# Patient Record
Sex: Female | Born: 1975 | Race: Black or African American | Hispanic: No | Marital: Single | Smoking: Never smoker
Health system: Southern US, Community
[De-identification: ages and names within clinical notes are randomized; demographics above are authoritative.]

## PROBLEM LIST (undated history)

## (undated) DIAGNOSIS — Z8489 Family history of other specified conditions: Secondary | ICD-10-CM

## (undated) DIAGNOSIS — R011 Cardiac murmur, unspecified: Secondary | ICD-10-CM

## (undated) DIAGNOSIS — D649 Anemia, unspecified: Secondary | ICD-10-CM

## (undated) DIAGNOSIS — T883XXA Malignant hyperthermia due to anesthesia, initial encounter: Secondary | ICD-10-CM

## (undated) HISTORY — PX: NO PAST SURGERIES: SHX2092

---

## 2010-06-08 ENCOUNTER — Emergency Department (HOSPITAL_BASED_OUTPATIENT_CLINIC_OR_DEPARTMENT_OTHER)
Admission: EM | Admit: 2010-06-08 | Discharge: 2010-06-08 | Payer: Self-pay | Source: Home / Self Care | Admitting: Emergency Medicine

## 2010-06-09 LAB — URINALYSIS, ROUTINE W REFLEX MICROSCOPIC
Bilirubin Urine: NEGATIVE
Protein, ur: NEGATIVE mg/dL
Urobilinogen, UA: 0.2 mg/dL (ref 0.0–1.0)

## 2010-06-09 LAB — BASIC METABOLIC PANEL
CO2: 25 mEq/L (ref 19–32)
Chloride: 108 mEq/L (ref 96–112)
Creatinine, Ser: 0.7 mg/dL (ref 0.4–1.2)
GFR calc Af Amer: >60 mL/min (ref >60)
Potassium: 4 mEq/L (ref 3.5–5.1)

## 2010-06-09 LAB — CBC
Hemoglobin: 13.1 g/dL (ref 12.0–15.0)
MCH: 25.6 pg — ABNORMAL LOW (ref 26.0–34.0)
MCV: 72.7 fL — ABNORMAL LOW (ref 78.0–100.0)
RBC: 5.12 MIL/uL — ABNORMAL HIGH (ref 3.87–5.11)

## 2010-06-09 LAB — DIFFERENTIAL
Basophils Relative: 0 % (ref 0–1)
Lymphs Abs: 1.7 10*3/uL (ref 0.7–4.0)
Monocytes Relative: 8 % (ref 3–12)
Neutro Abs: 2.5 10*3/uL (ref 1.7–7.7)
Neutrophils Relative %: 52 % (ref 43–77)

## 2010-09-12 ENCOUNTER — Emergency Department (HOSPITAL_BASED_OUTPATIENT_CLINIC_OR_DEPARTMENT_OTHER)
Admission: EM | Admit: 2010-09-12 | Discharge: 2010-09-13 | Disposition: A | Discharge status: Home / Self Care | Payer: Self-pay | Attending: Emergency Medicine | Admitting: Emergency Medicine

## 2010-09-12 DIAGNOSIS — N898 Other specified noninflammatory disorders of vagina: Secondary | ICD-10-CM | POA: Insufficient documentation

## 2010-09-12 DIAGNOSIS — D500 Iron deficiency anemia secondary to blood loss (chronic): Secondary | ICD-10-CM | POA: Insufficient documentation

## 2010-09-12 DIAGNOSIS — I10 Essential (primary) hypertension: Secondary | ICD-10-CM | POA: Insufficient documentation

## 2010-09-12 LAB — WET PREP, GENITAL: Trich, Wet Prep: NONE SEEN

## 2010-09-12 LAB — PREGNANCY, URINE: Preg Test, Ur: NEGATIVE

## 2010-09-12 LAB — URINALYSIS, ROUTINE W REFLEX MICROSCOPIC
Ketones, ur: NEGATIVE mg/dL
Nitrite: NEGATIVE
Protein, ur: NEGATIVE mg/dL
Urobilinogen, UA: 0.2 mg/dL (ref 0.0–1.0)
pH: 6 (ref 5.0–8.0)

## 2010-09-12 LAB — CBC
Hemoglobin: 9.6 g/dL — ABNORMAL LOW (ref 12.0–15.0)
MCH: 25.6 pg — ABNORMAL LOW (ref 26.0–34.0)
MCHC: 33.7 g/dL (ref 30.0–36.0)
Platelets: 260 10*3/uL (ref 150–400)
RDW: 12.5 % (ref 11.5–15.5)

## 2010-09-13 ENCOUNTER — Ambulatory Visit (INDEPENDENT_AMBULATORY_CARE_PROVIDER_SITE_OTHER)
Admission: RE | Admit: 2010-09-13 | Discharge: 2010-09-13 | Disposition: A | Discharge status: Home / Self Care | Payer: Self-pay | Source: Ambulatory Visit | Attending: Emergency Medicine | Admitting: Emergency Medicine

## 2010-09-13 ENCOUNTER — Ambulatory Visit (HOSPITAL_BASED_OUTPATIENT_CLINIC_OR_DEPARTMENT_OTHER)
Admission: RE | Admit: 2010-09-13 | Discharge: 2010-09-13 | Disposition: A | Discharge status: Home / Self Care | Payer: Self-pay | Source: Ambulatory Visit | Attending: Emergency Medicine | Admitting: Emergency Medicine

## 2010-09-13 ENCOUNTER — Other Ambulatory Visit (HOSPITAL_BASED_OUTPATIENT_CLINIC_OR_DEPARTMENT_OTHER): Payer: Self-pay | Admitting: Emergency Medicine

## 2010-09-13 DIAGNOSIS — N939 Abnormal uterine and vaginal bleeding, unspecified: Secondary | ICD-10-CM

## 2010-09-13 DIAGNOSIS — D279 Benign neoplasm of unspecified ovary: Secondary | ICD-10-CM | POA: Insufficient documentation

## 2010-09-13 DIAGNOSIS — N898 Other specified noninflammatory disorders of vagina: Secondary | ICD-10-CM

## 2010-09-13 DIAGNOSIS — R9389 Abnormal findings on diagnostic imaging of other specified body structures: Secondary | ICD-10-CM | POA: Insufficient documentation

## 2010-09-13 DIAGNOSIS — N920 Excessive and frequent menstruation with regular cycle: Secondary | ICD-10-CM | POA: Insufficient documentation

## 2010-09-14 LAB — GC/CHLAMYDIA PROBE AMP, GENITAL
Chlamydia, DNA Probe: POSITIVE — AB
GC Probe Amp, Genital: NEGATIVE

## 2011-04-10 ENCOUNTER — Encounter: Payer: Self-pay | Admitting: *Deleted

## 2011-04-10 ENCOUNTER — Emergency Department (HOSPITAL_BASED_OUTPATIENT_CLINIC_OR_DEPARTMENT_OTHER)
Admission: EM | Admit: 2011-04-10 | Discharge: 2011-04-10 | Disposition: A | Discharge status: Home / Self Care | Payer: Self-pay | Attending: Emergency Medicine | Admitting: Emergency Medicine

## 2011-04-10 DIAGNOSIS — R590 Localized enlarged lymph nodes: Secondary | ICD-10-CM

## 2011-04-10 DIAGNOSIS — R599 Enlarged lymph nodes, unspecified: Secondary | ICD-10-CM | POA: Insufficient documentation

## 2011-04-10 DIAGNOSIS — M542 Cervicalgia: Secondary | ICD-10-CM | POA: Insufficient documentation

## 2011-04-10 MED ORDER — CEPHALEXIN 500 MG PO CAPS: 500.0000 mg | ORAL_CAPSULE | Freq: Three times a day (TID) | ORAL | Status: AC

## 2011-04-10 NOTE — ED Provider Notes (Signed)
History     CSN: [id number] Arrival date & time: 04/10/2011  9:28 AM   First MD Initiated Contact with Patient 04/10/11 1002      Chief Complaint  Patient presents with  . Sore    (Consider location/radiation/quality/duration/timing/severity/associated sxs/prior treatment) HPI Comments: The patient states that she has had pain in the right side of her neck since last night.  She states she was bitten by an insect and scratched at the area and is now sore into her neck.  No fevers.  No sore throat.  No fevers or chills.  Worse with movement, palpation, no alleviating factors.  The history is provided by the patient.    History reviewed. No pertinent past medical history.  History reviewed. No pertinent past surgical history.  No family history on file.  History  Substance Use Topics  . Smoking status: Never Smoker   . Smokeless tobacco: Not on file  . Alcohol Use: No    OB History    Grav Para Term Preterm Abortions TAB SAB Ect Mult Living                  Review of Systems  All other systems reviewed and are negative.    Allergies  Review of patient's allergies indicates no known allergies.  Home Medications   Current Outpatient Rx  Name Route Sig Dispense Refill  . UNKNOWN TO PATIENT  pencillin for tooth infection       BP 154/91  Pulse 77  Temp(Src) 99 F (37.2 C) (Oral)  Resp 18  SpO2 100%  Physical Exam  Constitutional: She is oriented to person, place, and time. She appears well-developed and well-nourished. No distress.  HENT:  Head: Normocephalic and atraumatic.  Right Ear: External ear normal.  Left Ear: External ear normal.  Mouth/Throat: Oropharynx is clear and moist.  Neck: Normal range of motion. Neck supple.       There is a 2 cm palpable, tender lymph node on the ride side of the neck.    Cardiovascular: Normal rate and regular rhythm.   Pulmonary/Chest: Effort normal and breath sounds normal. No respiratory distress.    Musculoskeletal: Normal range of motion. She exhibits no edema.  Neurological: She is alert and oriented to person, place, and time.  Skin: Skin is warm and dry. She is not diaphoretic.    ED Course  Procedures (including critical care time)  Labs Reviewed - No data to display No results found.   No diagnosis found.    MDM  History, presentation consistent with lymphadenopathy.  Will treat with keflex, nsaids.  Follow up prn.        Geoffery Lyons, MD 04/10/11 1014

## 2011-04-10 NOTE — ED Notes (Signed)
Patient states that she woke up on Friday and noticed a sore on the R side of her neck that has become sore and swollen, hurts to touch and pain extends to shoulder

## 2011-12-20 IMAGING — CT CT ABD-PELV W/O CM
2 of 4 series · 17 of 46 positions shown, 19 images · non-contrast
Comparison: None

CLINICAL DATA: Suprapubic pain.

CT ABDOMEN AND PELVIS WITHOUT CONTRAST
TECHNIQUE: Multidetector CT imaging of the abdomen and pelvis was
performed following the standard protocol without intravenous
contrast.

[Series 2: renal stone < 200 lbs 5.0 b31f · axial · 0.76mm/px · z∈[-635,-220]mm · 14 of 91 slices shown, 16 images]
[im 4/91  soft-tissue]
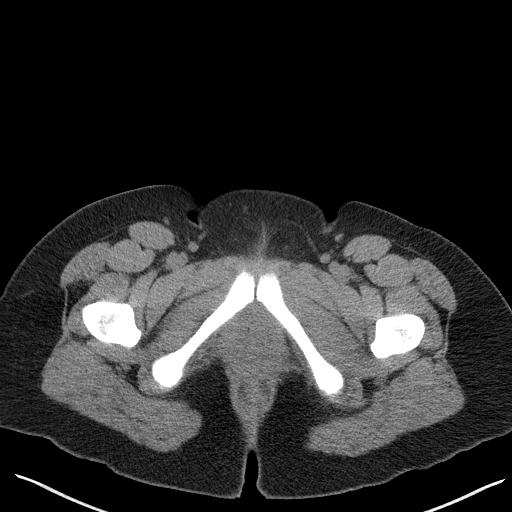
[im 4/91  bone]
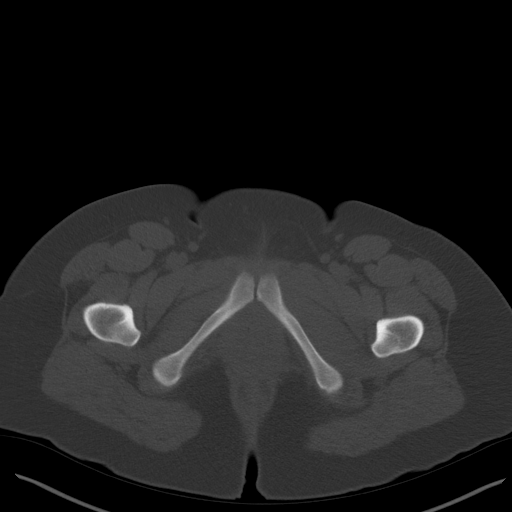
[im 12/91  soft-tissue]
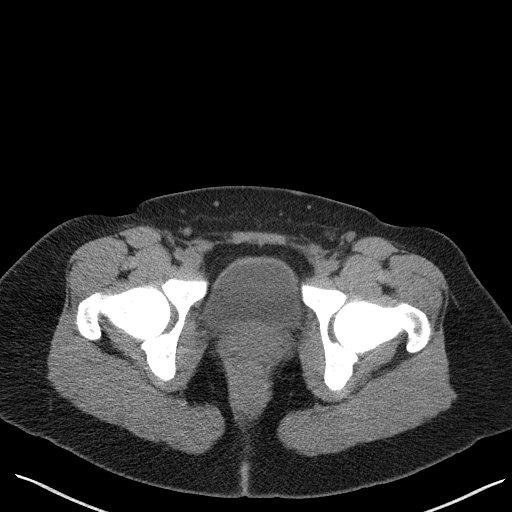
[im 19/91  soft-tissue]
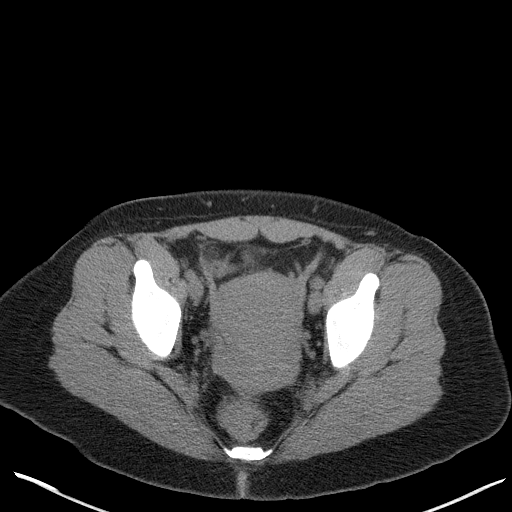
[im 23/91  soft-tissue]
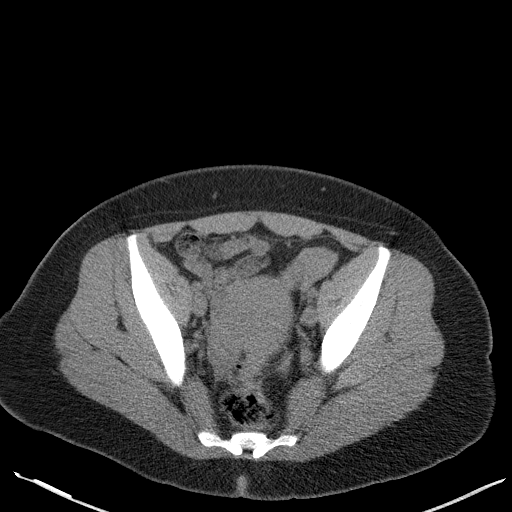
[im 31/91  soft-tissue]
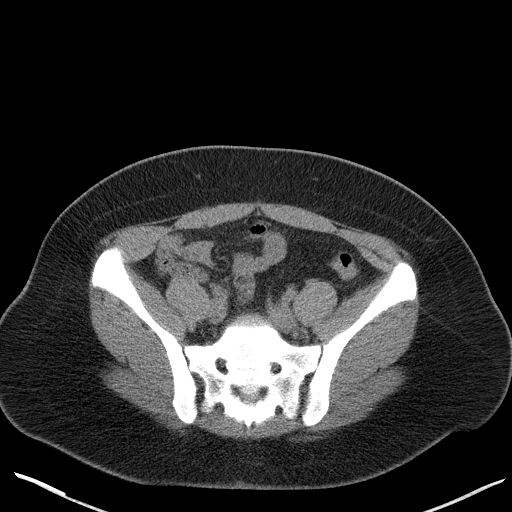
[im 38/91  soft-tissue]
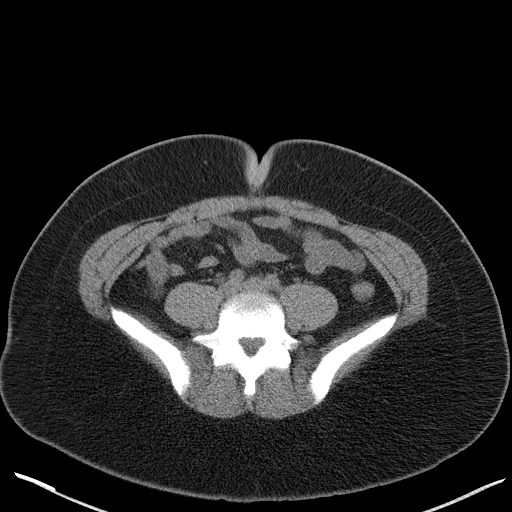
[im 42/91  soft-tissue]
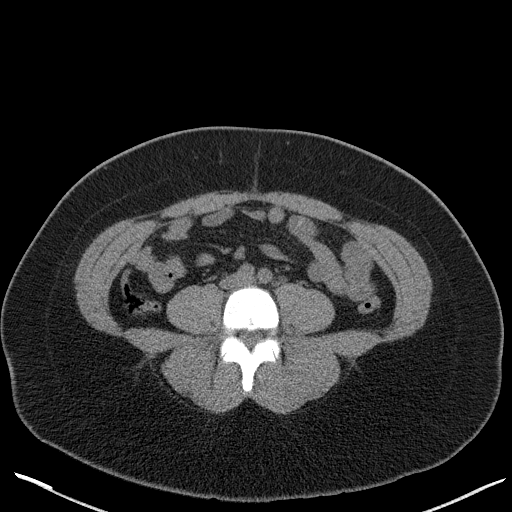
[im 49/91  soft-tissue]
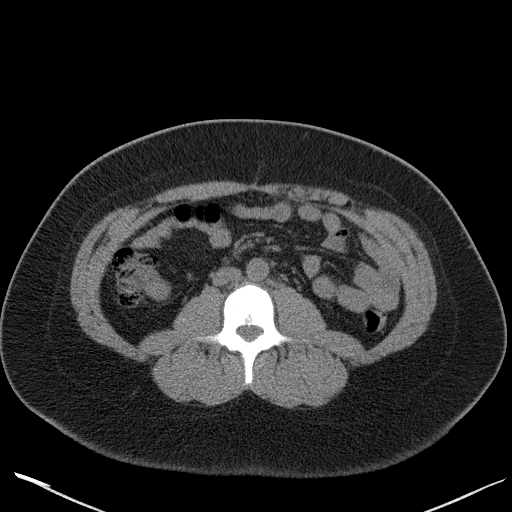
[im 53/91  soft-tissue]
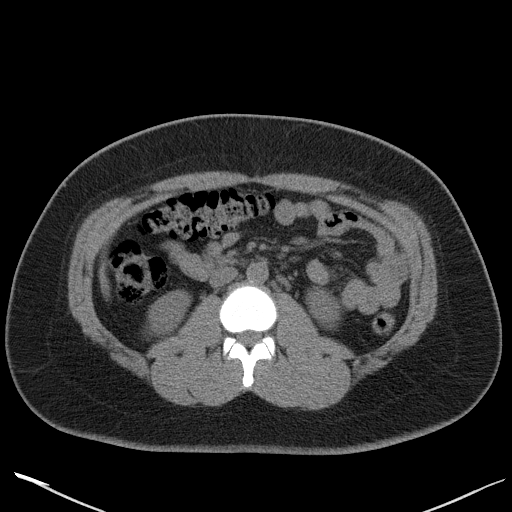
[im 53/91  bone]
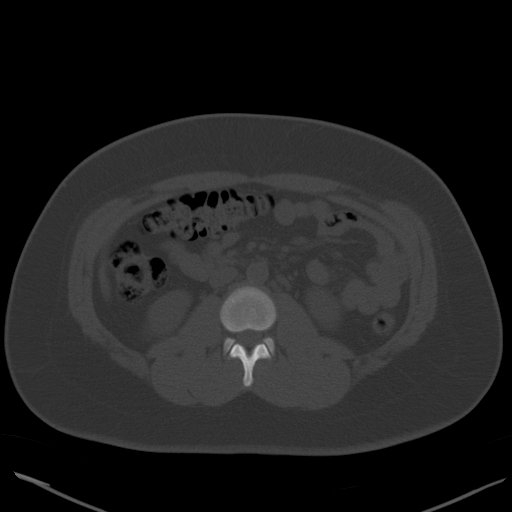
[im 61/91  soft-tissue]
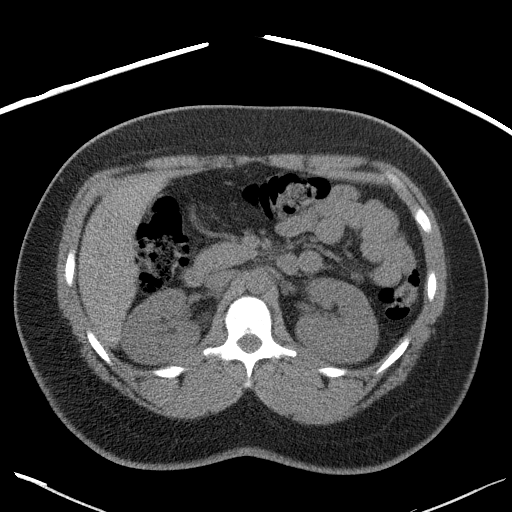
[im 68/91  soft-tissue]
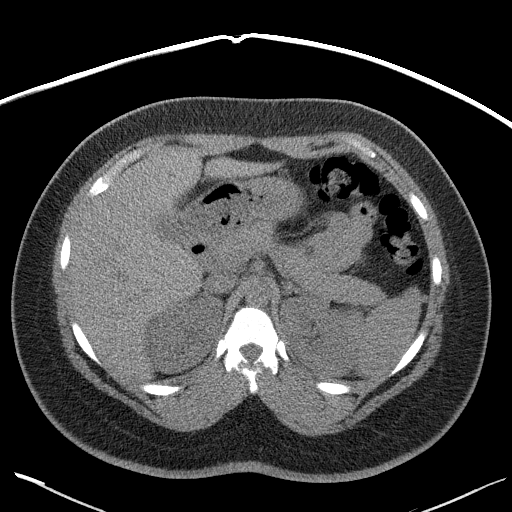
[im 72/91  soft-tissue]
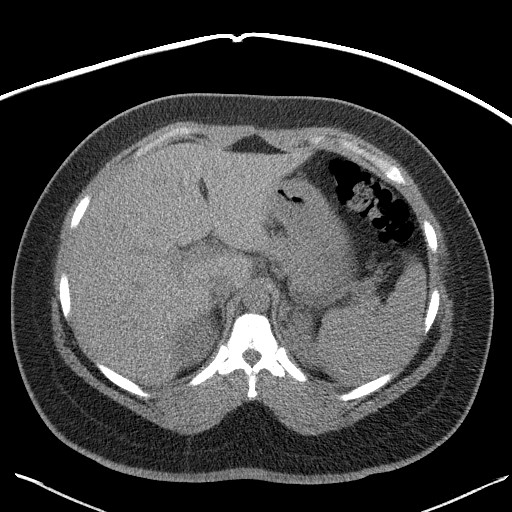
[im 79/91  soft-tissue]
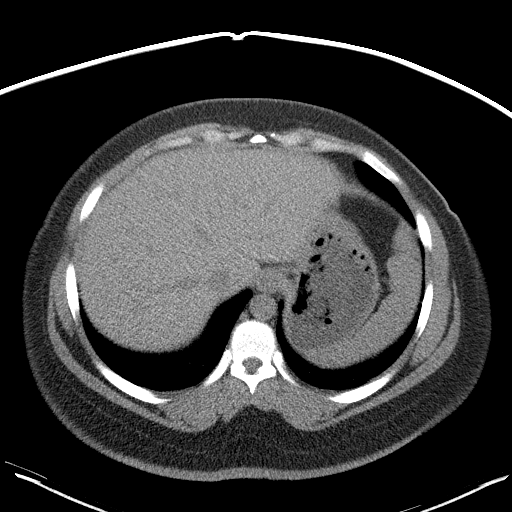
[im 87/91  soft-tissue]
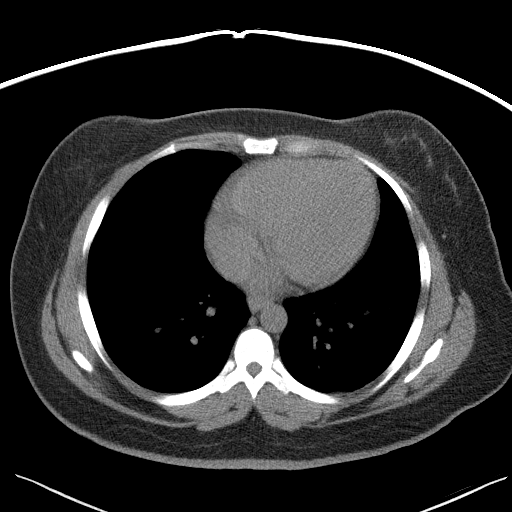

[Series 5: renal stone 3.0 coronal · coronal · 0.75mm/px · 3 of 90 slices shown]
[im 30/90  soft-tissue]
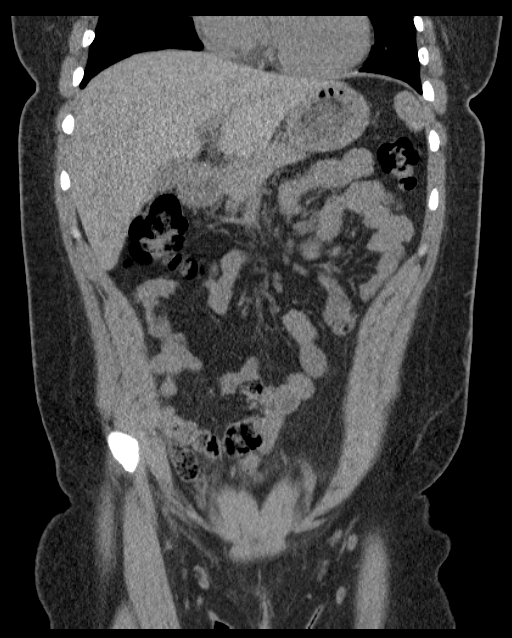
[im 40/90  soft-tissue]
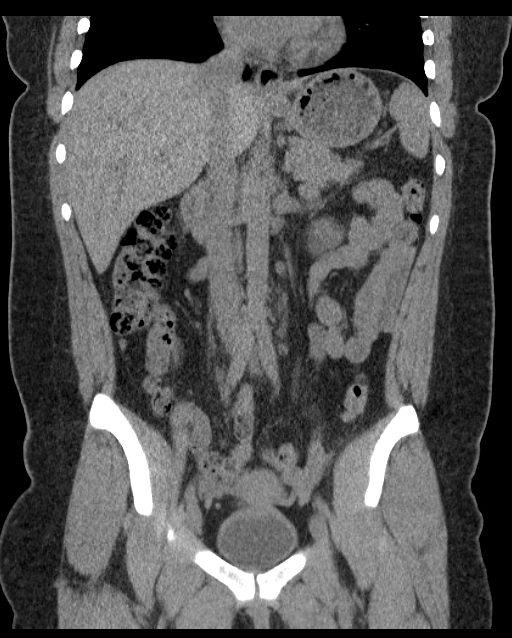
[im 50/90  soft-tissue]
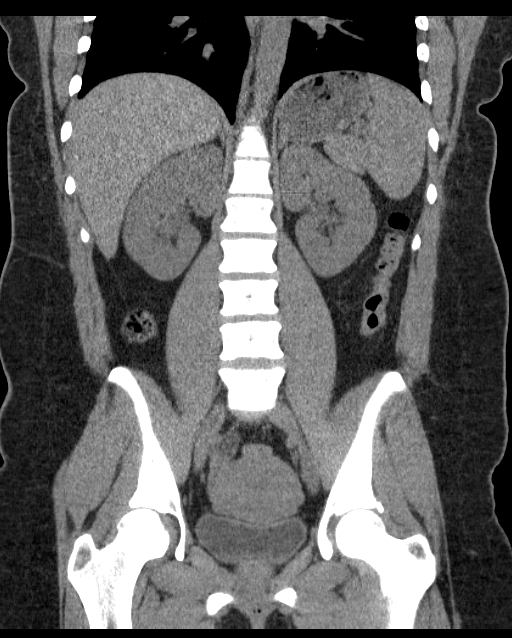

[17 of 46 positions shown; findings below may reference images not displayed]

FINDINGS: Lung bases are clear.  No effusions.  Heart is normal
size.

No renal or ureteral stones.  No hydronephrosis.  Urinary bladder
is grossly unremarkable.

Appendix is visualized and is normal. Bowel grossly unremarkable.
No free fluid, free air, or adenopathy.

Liver, gallbladder, spleen, pancreas, kidneys and adrenal glands
have an unremarkable unenhanced appearance.  Mildly prominent
central mesenteric lymph nodes are noted.  No pathologically
enlarged.

No acute bony abnormality.
IMPRESSION: No renal or ureteral stones.  No hydronephrosis.

No acute findings.

Appendix normal.

## 2012-01-11 ENCOUNTER — Other Ambulatory Visit: Payer: Self-pay | Admitting: Obstetrics and Gynecology

## 2012-01-17 ENCOUNTER — Inpatient Hospital Stay (HOSPITAL_COMMUNITY): Admission: RE | Admit: 2012-01-17 | Payer: Self-pay | Source: Ambulatory Visit

## 2012-02-09 ENCOUNTER — Encounter (HOSPITAL_COMMUNITY): Payer: Self-pay | Admitting: Pharmacist

## 2012-02-14 ENCOUNTER — Other Ambulatory Visit (HOSPITAL_COMMUNITY): Payer: BC Managed Care – PPO

## 2012-02-16 ENCOUNTER — Inpatient Hospital Stay (HOSPITAL_COMMUNITY): Admission: RE | Admit: 2012-02-16 | Payer: BC Managed Care – PPO | Source: Ambulatory Visit

## 2012-02-23 ENCOUNTER — Ambulatory Visit (HOSPITAL_COMMUNITY)
Admission: RE | Admit: 2012-02-23 | Payer: BC Managed Care – PPO | Source: Ambulatory Visit | Admitting: Obstetrics and Gynecology

## 2012-02-23 ENCOUNTER — Encounter (HOSPITAL_COMMUNITY): Admission: RE | Payer: Self-pay | Source: Ambulatory Visit

## 2012-02-23 SURGERY — DILATATION & CURETTAGE/HYSTEROSCOPY WITH RESECTOCOPE
Anesthesia: Choice

## 2012-03-26 IMAGING — US US TRANSVAGINAL NON-OB
1 series · 14 of 25 positions shown · non-contrast
Comparison: None.

CLINICAL DATA: Heavy, prolonged menses



[Series 1: us transvaginal non-ob · 0.33mm/px · 14 of 64 slices shown]
[im 1/64]
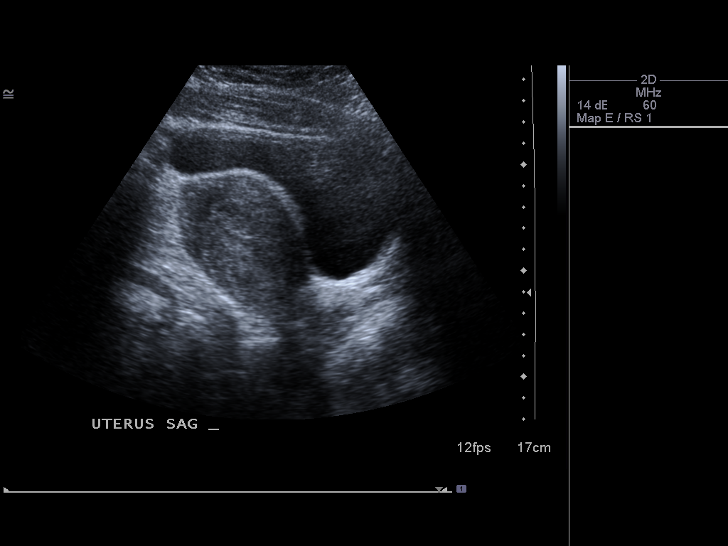
[im 6/64]
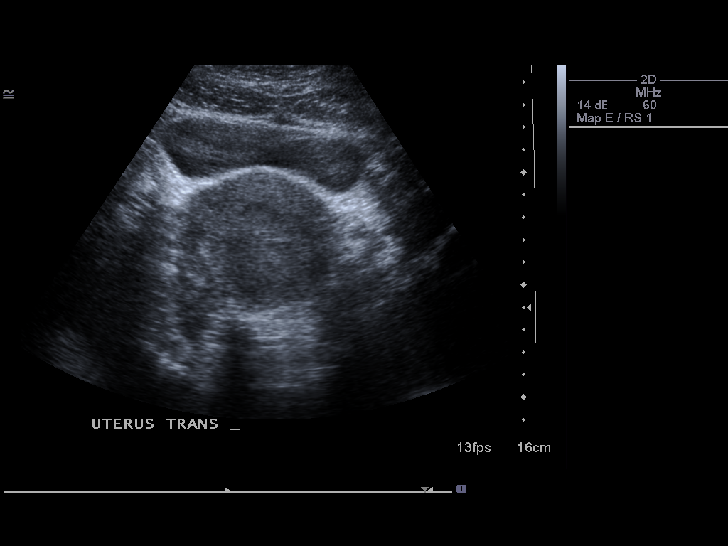
[im 11/64]
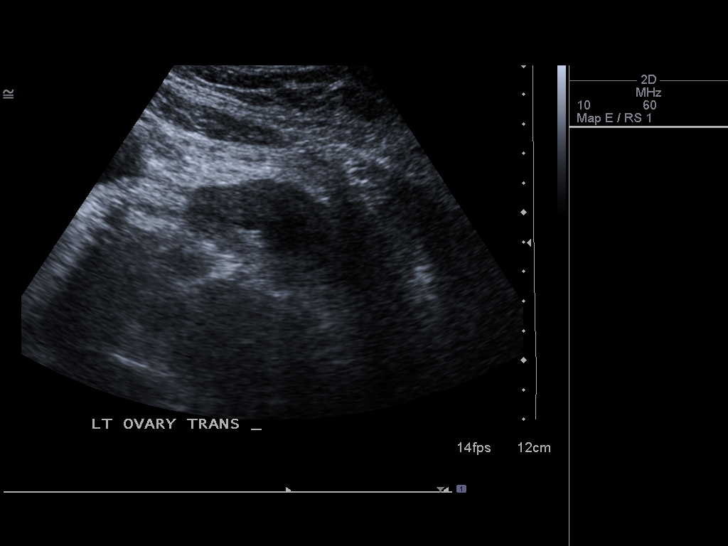
[im 16/64]
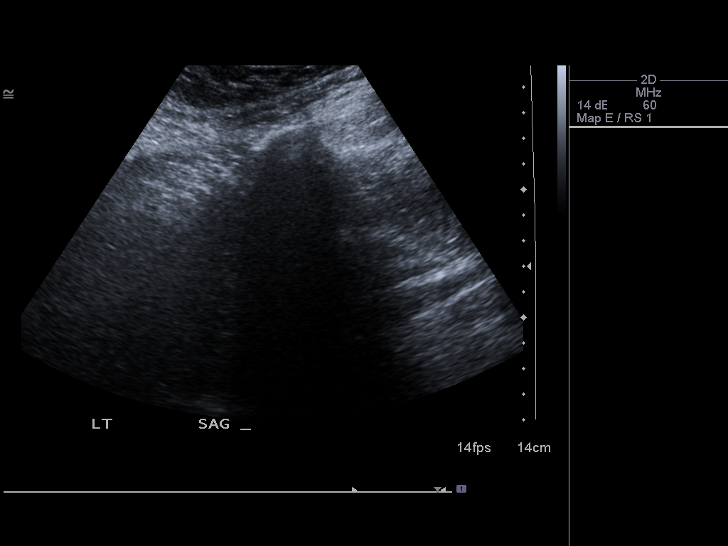
[im 22/64]
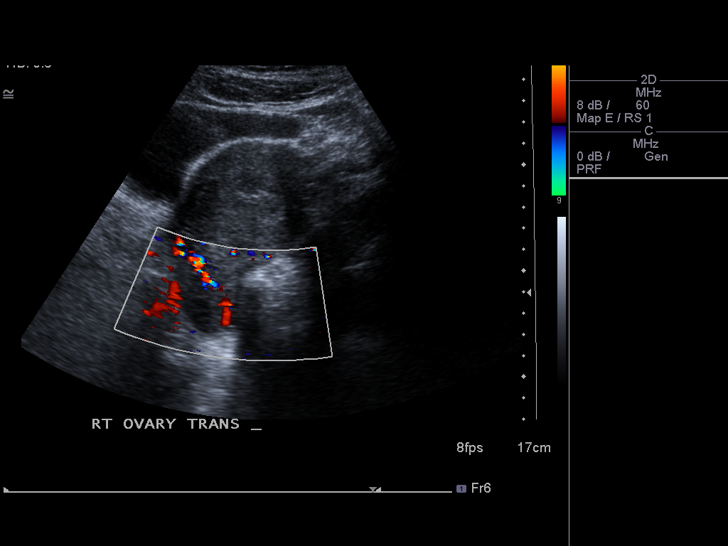
[im 24/64]
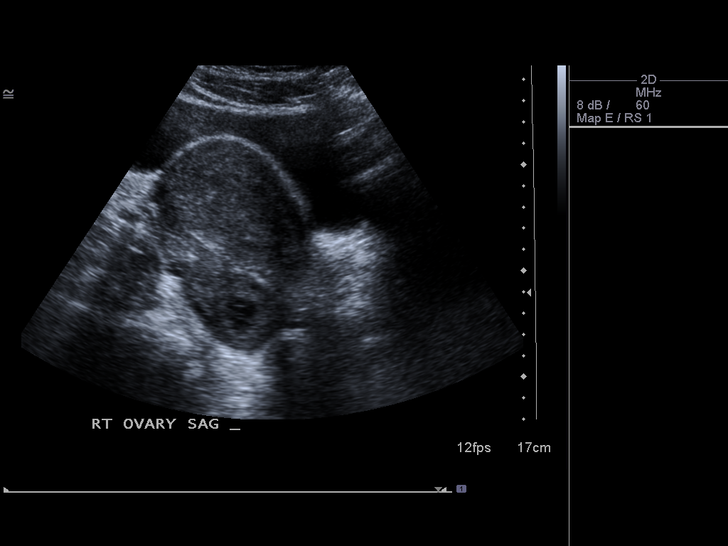
[im 29/64]
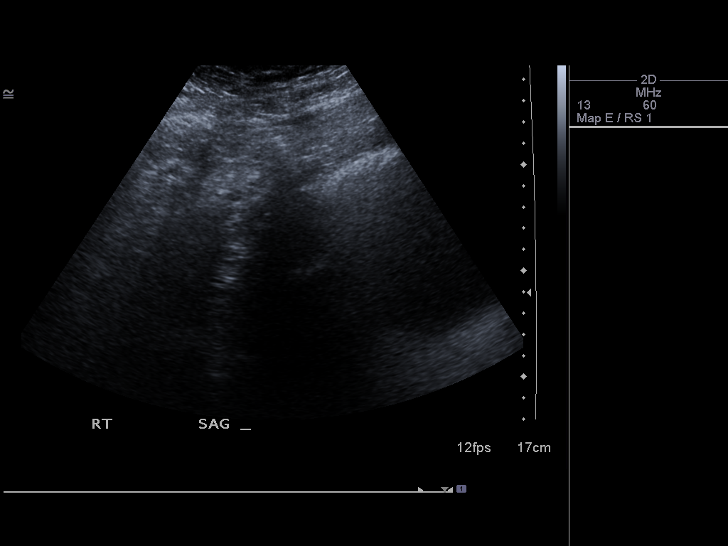
[im 35/64]
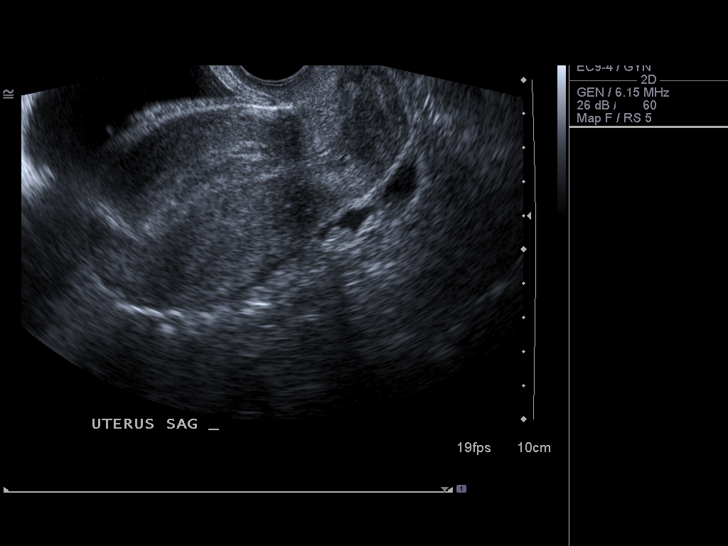
[im 40/64]
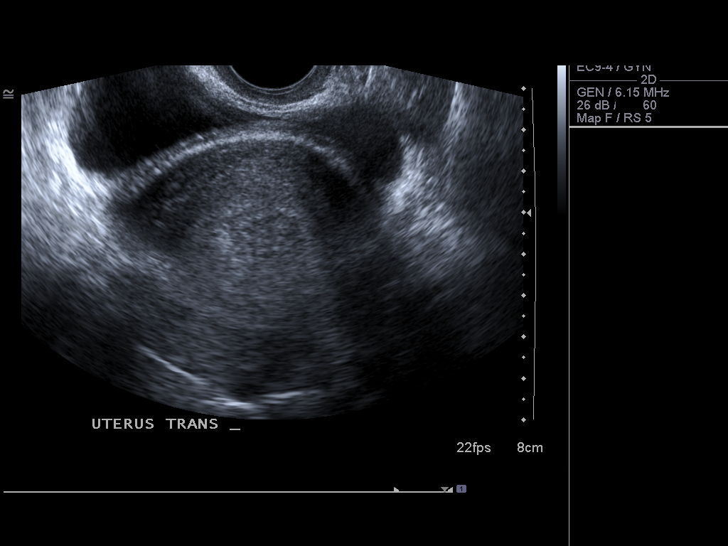
[im 43/64]
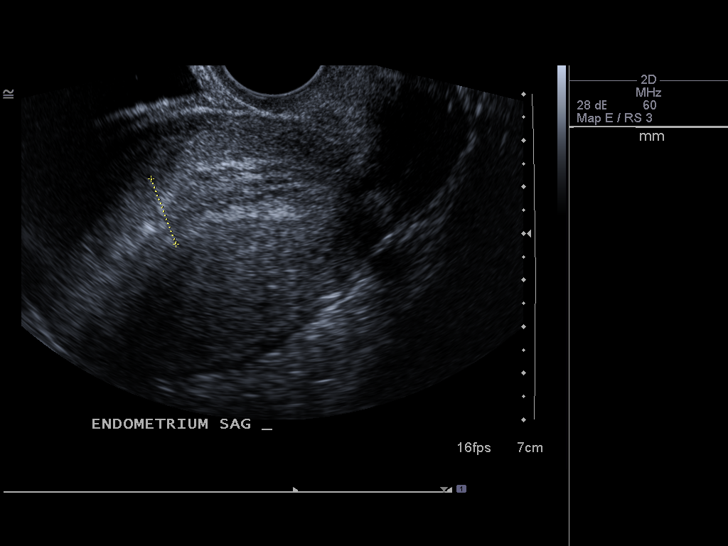
[im 48/64]
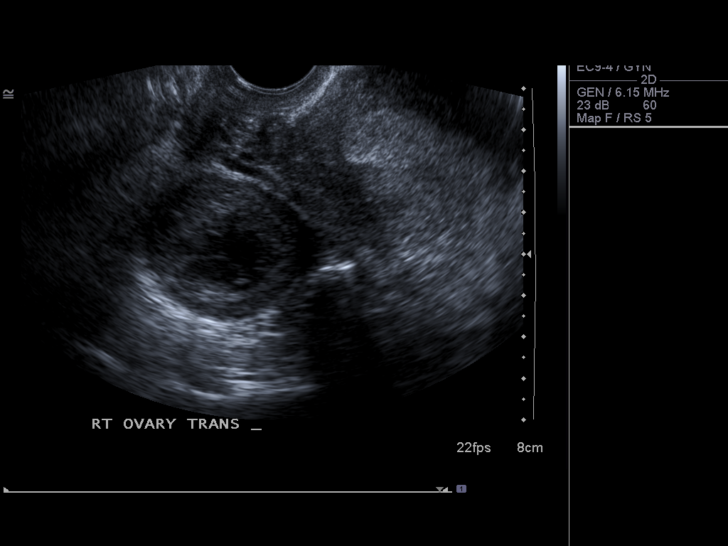
[im 53/64]
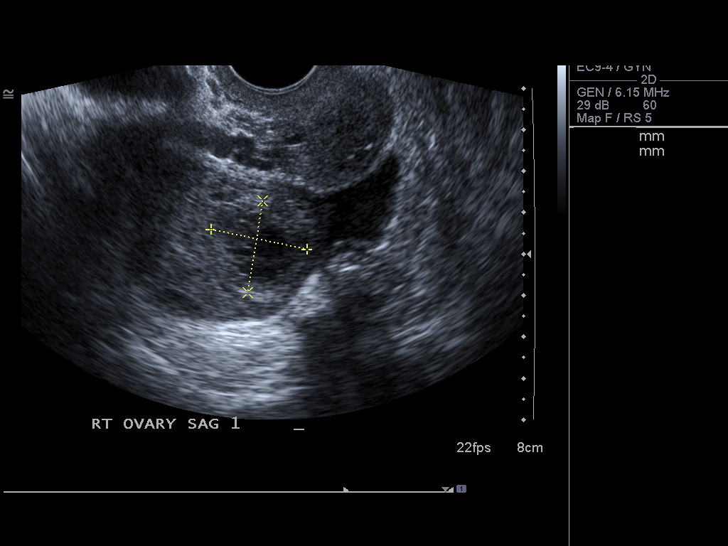
[im 58/64]
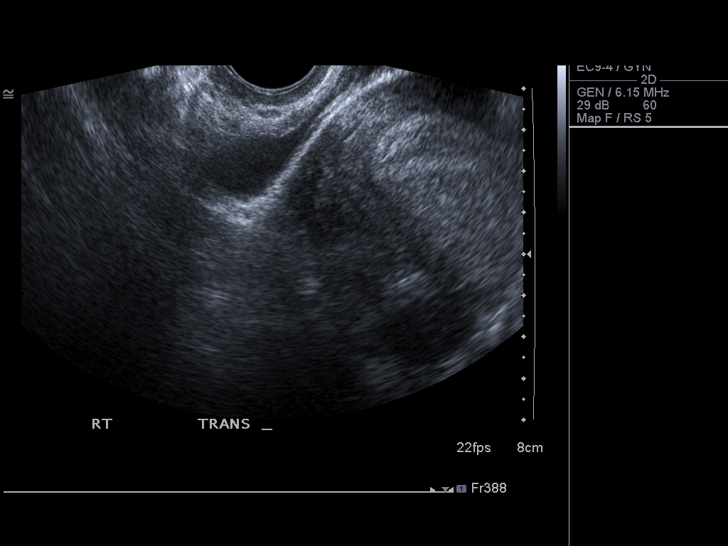
[im 64/64]
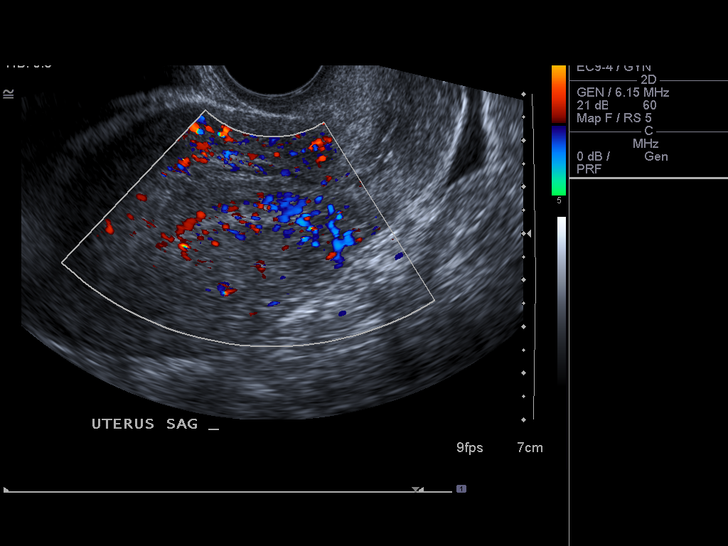

[14 of 25 positions shown; findings below may reference images not displayed]

FINDINGS: Uterus:  10.3 x 5.0 x 6.4 cm (length x AP x width.  No fibroids or
other uterine masses identified.

Endometrium:  15 mm and heterogeneous.  No focal mass.

Right ovary:  5.1 x 3.9 x 3.4 cm.  There is a complex cystic lesion
measuring 2.4 x 2.5 x 2.2 cm.

Left ovary:  3.7 x 3.0 x 3.2 cm.  Normal appearance/no adnexal
mass.

Other findings:  A trace of free fluid is noted.

Consider short-term follow-up exam to further assess the
endometrium and the right ovary.
IMPRESSION: 1.  Thickened, heterogeneous endometrium.
2.  2.5 cm complex cyst of the right ovary.
3.  Consider follow-up exam in several weeks to further assess #1
and #2.

## 2012-06-19 ENCOUNTER — Other Ambulatory Visit: Payer: Self-pay | Admitting: Obstetrics and Gynecology

## 2012-07-02 ENCOUNTER — Other Ambulatory Visit: Payer: Self-pay | Admitting: Obstetrics and Gynecology

## 2012-07-09 ENCOUNTER — Encounter (HOSPITAL_COMMUNITY): Payer: Self-pay

## 2012-07-09 ENCOUNTER — Encounter (HOSPITAL_COMMUNITY)
Admission: RE | Admit: 2012-07-09 | Discharge: 2012-07-09 | Disposition: A | Discharge status: Home / Self Care | Payer: 59 | Source: Ambulatory Visit | Attending: Obstetrics and Gynecology | Admitting: Obstetrics and Gynecology

## 2012-07-09 HISTORY — DX: Anemia, unspecified: D64.9

## 2012-07-09 HISTORY — DX: Family history of other specified conditions: Z84.89

## 2012-07-09 HISTORY — DX: Malignant hyperthermia due to anesthesia, initial encounter: T88.3XXA

## 2012-07-09 HISTORY — DX: Cardiac murmur, unspecified: R01.1

## 2012-07-09 LAB — BASIC METABOLIC PANEL
Calcium: 8.8 mg/dL (ref 8.4–10.5)
Creatinine, Ser: 0.63 mg/dL (ref 0.50–1.10)
GFR calc Af Amer: >90 mL/min (ref >90)
GFR calc non Af Amer: >90 mL/min (ref >90)
Sodium: 137 mEq/L (ref 135–145)

## 2012-07-09 LAB — CBC
MCH: 22.4 pg — ABNORMAL LOW (ref 26.0–34.0)
MCV: 70.9 fL — ABNORMAL LOW (ref 78.0–100.0)
Platelets: 240 10*3/uL (ref 150–400)
RDW: 14.3 % (ref 11.5–15.5)

## 2012-07-09 NOTE — Patient Instructions (Addendum)
Your procedure is scheduled on:07/13/12  Enter through the Main Entrance at :8am Pick up desk phone and dial [phone] and inform us of your arrival.  Please call [phone] if you have any problems the morning of surgery.  Remember: Do not eat or drink after midnight:Thursday   Take these meds the morning of surgery with a sip of water:HCTZ- BP med  DO NOT wear jewelry, eye make-up, lipstick,body lotion, or dark fingernail polish. Do not shave for 48 hours prior to surgery.  If you are to be admitted after surgery, leave suitcase in car until your room has been assigned. Patients discharged on the day of surgery will not be allowed to drive home.

## 2012-07-09 NOTE — Pre-Procedure Instructions (Signed)
Dr. Sheral Apley notified of pt's family history of malignant hyperthermia. He interviewed pt at PAT appt.

## 2012-07-09 NOTE — Anesthesia Preprocedure Evaluation (Signed)
Anesthesia Evaluation  Patient identified by MRN, date of birth, ID band Patient awake    Reviewed: Allergy & Precautions, H&P , Patient's Chart, lab work & pertinent test results, reviewed documented beta blocker date and time   History of Anesthesia Complications (+) MALIGNANT HYPERTHERMIA and Family history of anesthesia reaction  Airway Mallampati: II TM Distance: >3 FB Neck ROM: full    Dental no notable dental hx.    Pulmonary neg pulmonary ROS,  breath sounds clear to auscultation  Pulmonary exam normal       Cardiovascular Exercise Tolerance: Good negative cardio ROS  Rhythm:regular Rate:Normal     Neuro/Psych negative neurological ROS  negative psych ROS   GI/Hepatic negative GI ROS, Neg liver ROS,   Endo/Other  negative endocrine ROS  Renal/GU negative Renal ROS     Musculoskeletal   Abdominal   Peds  Hematology negative hematology ROS (+) anemia ,   Anesthesia Other Findings Family history of MH in two family members.  [place] lineage  Reproductive/Obstetrics negative OB ROS                           Anesthesia Physical Anesthesia Plan  ASA: II  Anesthesia Plan: General ETT   Post-op Pain Management:    Induction:   Airway Management Planned:   Additional Equipment:   Intra-op Plan:   Post-operative Plan:   Informed Consent: I have reviewed the patients History and Physical, chart, labs and discussed the procedure including the risks, benefits and alternatives for the proposed anesthesia with the patient or authorized representative who has indicated his/her understanding and acceptance.   Dental Advisory Given  Plan Discussed with: CRNA and Surgeon  Anesthesia Plan Comments:        Non-triggering  Anesthetic. Anesthesia Quick Evaluation

## 2012-07-10 NOTE — Pre-Procedure Instructions (Signed)
Dr. Arby Barrette notified of pt's K+ result of 3.0. He wants Dr. Cherly Hensen to order the pt a K+ supplement. I talked with Birdena Crandall in Dr. Cherly Hensen office and she will give message to Dr. Cherly Hensen.

## 2012-07-12 ENCOUNTER — Encounter (HOSPITAL_COMMUNITY): Payer: Self-pay | Admitting: Pharmacist

## 2012-07-13 ENCOUNTER — Ambulatory Visit (HOSPITAL_COMMUNITY)
Admission: RE | Admit: 2012-07-13 | Discharge: 2012-07-13 | Disposition: A | Discharge status: Home / Self Care | Payer: 59 | Source: Ambulatory Visit | Attending: Obstetrics and Gynecology | Admitting: Obstetrics and Gynecology

## 2012-07-13 ENCOUNTER — Ambulatory Visit (HOSPITAL_COMMUNITY): Payer: 59 | Admitting: Registered Nurse

## 2012-07-13 ENCOUNTER — Encounter (HOSPITAL_COMMUNITY): Payer: Self-pay | Admitting: Registered Nurse

## 2012-07-13 ENCOUNTER — Encounter (HOSPITAL_COMMUNITY)
Admission: RE | Disposition: A | Discharge status: Home / Self Care | Payer: Self-pay | Source: Ambulatory Visit | Attending: Obstetrics and Gynecology

## 2012-07-13 ENCOUNTER — Encounter (HOSPITAL_COMMUNITY): Payer: Self-pay | Admitting: Anesthesiology

## 2012-07-13 DIAGNOSIS — N920 Excessive and frequent menstruation with regular cycle: Secondary | ICD-10-CM | POA: Insufficient documentation

## 2012-07-13 DIAGNOSIS — N840 Polyp of corpus uteri: Secondary | ICD-10-CM | POA: Insufficient documentation

## 2012-07-13 DIAGNOSIS — N83209 Unspecified ovarian cyst, unspecified side: Secondary | ICD-10-CM | POA: Insufficient documentation

## 2012-07-13 DIAGNOSIS — R1032 Left lower quadrant pain: Secondary | ICD-10-CM | POA: Insufficient documentation

## 2012-07-13 HISTORY — PX: DILATATION & CURRETTAGE/HYSTEROSCOPY WITH RESECTOCOPE: SHX5572

## 2012-07-13 HISTORY — PX: POLYPECTOMY: SHX5525

## 2012-07-13 HISTORY — PX: LAPAROSCOPIC OVARIAN CYSTECTOMY: SHX6248

## 2012-07-13 HISTORY — PX: LAPAROSCOPIC LYSIS OF ADHESIONS: SHX5905

## 2012-07-13 LAB — PREGNANCY, URINE: Preg Test, Ur: NEGATIVE

## 2012-07-13 SURGERY — DILATATION & CURETTAGE/HYSTEROSCOPY WITH RESECTOCOPE
Anesthesia: General | Site: Vagina | Wound class: Clean Contaminated

## 2012-07-13 MED ORDER — NEOSTIGMINE METHYLSULFATE 1 MG/ML IJ SOLN
INTRAMUSCULAR | Status: DC | PRN
  Administered 2012-07-13: 3 mg via INTRAVENOUS

## 2012-07-13 MED ORDER — PROPOFOL 10 MG/ML IV EMUL
INTRAVENOUS | Status: AC
  Filled 2012-07-13: qty 60

## 2012-07-13 MED ORDER — ONDANSETRON HCL 4 MG/2ML IJ SOLN
INTRAMUSCULAR | Status: AC
  Filled 2012-07-13: qty 2

## 2012-07-13 MED ORDER — PROPOFOL INFUSION 10 MG/ML OPTIME
INTRAVENOUS | Status: DC | PRN
  Administered 2012-07-13: 100 ug/kg/min via INTRAVENOUS

## 2012-07-13 MED ORDER — PROPOFOL 10 MG/ML IV EMUL
INTRAVENOUS | Status: AC
  Filled 2012-07-13: qty 20

## 2012-07-13 MED ORDER — CHLOROPROCAINE HCL 1 % IJ SOLN
INTRAMUSCULAR | Status: AC
  Filled 2012-07-13: qty 30

## 2012-07-13 MED ORDER — DOXYCYCLINE HYCLATE 100 MG IV SOLR
100.0000 mg | Freq: Once | INTRAVENOUS | Status: AC
  Administered 2012-07-13: 100 mg via INTRAVENOUS
  Filled 2012-07-13: qty 100

## 2012-07-13 MED ORDER — MIDAZOLAM HCL 5 MG/5ML IJ SOLN
INTRAMUSCULAR | Status: DC | PRN
  Administered 2012-07-13: 2 mg via INTRAVENOUS

## 2012-07-13 MED ORDER — NEOSTIGMINE METHYLSULFATE 1 MG/ML IJ SOLN
INTRAMUSCULAR | Status: AC
  Filled 2012-07-13: qty 1

## 2012-07-13 MED ORDER — BUPIVACAINE HCL (PF) 0.25 % IJ SOLN
INTRAMUSCULAR | Status: AC
  Filled 2012-07-13: qty 30

## 2012-07-13 MED ORDER — MIDAZOLAM HCL 2 MG/2ML IJ SOLN
INTRAMUSCULAR | Status: AC
  Filled 2012-07-13: qty 2

## 2012-07-13 MED ORDER — LABETALOL HCL 5 MG/ML IV SOLN
INTRAVENOUS | Status: DC | PRN
  Administered 2012-07-13: 10 mg via INTRAVENOUS
  Administered 2012-07-13 (×4): 5 mg via INTRAVENOUS
  Administered 2012-07-13: 10 mg via INTRAVENOUS

## 2012-07-13 MED ORDER — DEXAMETHASONE SODIUM PHOSPHATE 10 MG/ML IJ SOLN
INTRAMUSCULAR | Status: DC | PRN
  Administered 2012-07-13: 10 mg via INTRAVENOUS

## 2012-07-13 MED ORDER — SODIUM CHLORIDE 0.9 % IJ SOLN
INTRAMUSCULAR | Status: DC | PRN
  Administered 2012-07-13: 10 mL

## 2012-07-13 MED ORDER — LACTATED RINGERS IV SOLN
INTRAVENOUS | Status: DC
  Administered 2012-07-13 (×2): via INTRAVENOUS

## 2012-07-13 MED ORDER — KETOROLAC TROMETHAMINE 30 MG/ML IJ SOLN
INTRAMUSCULAR | Status: DC | PRN
  Administered 2012-07-13: 30 mg via INTRAVENOUS
  Administered 2012-07-13: 30 mg via INTRAMUSCULAR

## 2012-07-13 MED ORDER — GLYCOPYRROLATE 0.2 MG/ML IJ SOLN
INTRAMUSCULAR | Status: AC
  Filled 2012-07-13: qty 2

## 2012-07-13 MED ORDER — CHLOROPROCAINE HCL 1 % IJ SOLN
INTRAMUSCULAR | Status: DC | PRN
  Administered 2012-07-13: 20 mL

## 2012-07-13 MED ORDER — BUPIVACAINE HCL (PF) 0.25 % IJ SOLN
INTRAMUSCULAR | Status: DC | PRN
  Administered 2012-07-13: 6 mL

## 2012-07-13 MED ORDER — GLYCOPYRROLATE 0.2 MG/ML IJ SOLN
INTRAMUSCULAR | Status: DC | PRN
  Administered 2012-07-13: 0.4 mg via INTRAVENOUS

## 2012-07-13 MED ORDER — GLYCINE 1.5 % IR SOLN
Status: DC | PRN
  Administered 2012-07-13: 3000 mL

## 2012-07-13 MED ORDER — IBUPROFEN 200 MG PO TABS: 800.0000 mg | ORAL_TABLET | Freq: Three times a day (TID) | ORAL | Status: AC | PRN

## 2012-07-13 MED ORDER — FENTANYL CITRATE 0.05 MG/ML IJ SOLN
INTRAMUSCULAR | Status: AC
  Filled 2012-07-13: qty 5

## 2012-07-13 MED ORDER — ONDANSETRON HCL 4 MG/2ML IJ SOLN
INTRAMUSCULAR | Status: DC | PRN
  Administered 2012-07-13: 4 mg via INTRAVENOUS

## 2012-07-13 MED ORDER — ROPIVACAINE HCL 5 MG/ML IJ SOLN
INTRAMUSCULAR | Status: DC | PRN
  Administered 2012-07-13: 30 mL

## 2012-07-13 MED ORDER — FENTANYL CITRATE 0.05 MG/ML IJ SOLN
INTRAMUSCULAR | Status: DC | PRN
  Administered 2012-07-13 (×2): 50 ug via INTRAVENOUS
  Administered 2012-07-13: 100 ug via INTRAVENOUS
  Administered 2012-07-13: 150 ug via INTRAVENOUS
  Administered 2012-07-13: 100 ug via INTRAVENOUS
  Administered 2012-07-13 (×4): 50 ug via INTRAVENOUS

## 2012-07-13 MED ORDER — LABETALOL HCL 5 MG/ML IV SOLN
INTRAVENOUS | Status: AC
  Filled 2012-07-13: qty 4

## 2012-07-13 MED ORDER — LIDOCAINE HCL (CARDIAC) 20 MG/ML IV SOLN
INTRAVENOUS | Status: DC | PRN
  Administered 2012-07-13: 100 mg via INTRAVENOUS

## 2012-07-13 MED ORDER — KETOROLAC TROMETHAMINE 30 MG/ML IJ SOLN
INTRAMUSCULAR | Status: AC
  Filled 2012-07-13: qty 2

## 2012-07-13 MED ORDER — DEXAMETHASONE SODIUM PHOSPHATE 10 MG/ML IJ SOLN
INTRAMUSCULAR | Status: AC
  Filled 2012-07-13: qty 1

## 2012-07-13 MED ORDER — LIDOCAINE HCL (CARDIAC) 20 MG/ML IV SOLN
INTRAVENOUS | Status: AC
  Filled 2012-07-13: qty 5

## 2012-07-13 MED ORDER — LACTATED RINGERS IR SOLN
Status: DC | PRN
  Administered 2012-07-13: 3000 mL

## 2012-07-13 MED ORDER — ROPIVACAINE HCL 5 MG/ML IJ SOLN
INTRAMUSCULAR | Status: AC
  Filled 2012-07-13: qty 30

## 2012-07-13 MED ORDER — OXYCODONE-ACETAMINOPHEN 5-325 MG PO TABS: 1.0000 | ORAL_TABLET | ORAL | Status: AC | PRN

## 2012-07-13 MED ORDER — ROCURONIUM BROMIDE 100 MG/10ML IV SOLN
INTRAVENOUS | Status: DC | PRN
  Administered 2012-07-13: 40 mg via INTRAVENOUS
  Administered 2012-07-13: 10 mg via INTRAVENOUS
  Administered 2012-07-13: 20 mg via INTRAVENOUS
  Administered 2012-07-13: 10 mg via INTRAVENOUS

## 2012-07-13 MED ORDER — PROPOFOL 10 MG/ML IV BOLUS
INTRAVENOUS | Status: DC | PRN
  Administered 2012-07-13: 200 mg via INTRAVENOUS

## 2012-07-13 MED ORDER — ROCURONIUM BROMIDE 50 MG/5ML IV SOLN
INTRAVENOUS | Status: AC
  Filled 2012-07-13: qty 1

## 2012-07-13 SURGICAL SUPPLY — 41 items
APPLICATOR COTTON TIP 6IN STRL (MISCELLANEOUS) ×5 IMPLANT
CABLE HIGH FREQUENCY MONO STRZ (ELECTRODE) ×5 IMPLANT
CANISTER SUCTION 2500CC (MISCELLANEOUS) ×5 IMPLANT
CATH ROBINSON RED A/P 16FR (CATHETERS) ×10 IMPLANT
CLOTH BEACON ORANGE TIMEOUT ST (SAFETY) ×5 IMPLANT
CONTAINER PREFILL 10% NBF 60ML (FORM) ×5 IMPLANT
DERMABOND ADVANCED (GAUZE/BANDAGES/DRESSINGS) ×1
DERMABOND ADVANCED .7 DNX12 (GAUZE/BANDAGES/DRESSINGS) ×4 IMPLANT
DRESSING TELFA 8X3 (GAUZE/BANDAGES/DRESSINGS) ×5 IMPLANT
ELECT LIGASURE LONG (ELECTRODE) IMPLANT
ELECT REM PT RETURN 9FT ADLT (ELECTROSURGICAL) ×5
ELECTRODE REM PT RTRN 9FT ADLT (ELECTROSURGICAL) ×4 IMPLANT
EVACUATOR SMOKE 8.L (FILTER) IMPLANT
FORCEPS CUTTING 33CM 5MM (CUTTING FORCEPS) IMPLANT
FORCEPS CUTTING 45CM 5MM (CUTTING FORCEPS) IMPLANT
GLOVE BIO SURGEON STRL SZ 6.5 (GLOVE) ×10 IMPLANT
GLOVE BIOGEL PI IND STRL 7.0 (GLOVE) ×4 IMPLANT
GLOVE BIOGEL PI INDICATOR 7.0 (GLOVE) ×1
GLOVE INDICATOR 7.5 STRL GRN (GLOVE) ×15 IMPLANT
GOWN PREVENTION PLUS LG XLONG (DISPOSABLE) ×15 IMPLANT
GOWN STRL REIN XL XLG (GOWN DISPOSABLE) ×5 IMPLANT
LOOP ANGLED CUTTING 22FR (CUTTING LOOP) ×5 IMPLANT
NEEDLE INSUFFLATION 120MM (ENDOMECHANICALS) ×5 IMPLANT
NS IRRIG 1000ML POUR BTL (IV SOLUTION) ×5 IMPLANT
PACK HYSTEROSCOPY LF (CUSTOM PROCEDURE TRAY) ×5 IMPLANT
PACK LAPAROSCOPY BASIN (CUSTOM PROCEDURE TRAY) ×5 IMPLANT
PAD OB MATERNITY 4.3X12.25 (PERSONAL CARE ITEMS) ×5 IMPLANT
POUCH SPECIMEN RETRIEVAL 10MM (ENDOMECHANICALS) IMPLANT
PROTECTOR NERVE ULNAR (MISCELLANEOUS) ×5 IMPLANT
SCISSORS LAP 5X35 DISP (ENDOMECHANICALS) ×5 IMPLANT
SET IRRIG TUBING LAPAROSCOPIC (IRRIGATION / IRRIGATOR) ×5 IMPLANT
SOLUTION ELECTROLUBE (MISCELLANEOUS) IMPLANT
SUT VICRYL 0 UR6 27IN ABS (SUTURE) ×5 IMPLANT
SUT VICRYL 4-0 PS2 18IN ABS (SUTURE) ×5 IMPLANT
SYR 30ML LL (SYRINGE) ×5 IMPLANT
TOWEL OR 17X24 6PK STRL BLUE (TOWEL DISPOSABLE) ×10 IMPLANT
TROCAR BALLN 12MMX100 BLUNT (TROCAR) IMPLANT
TROCAR OPTI TIP 5M 100M (ENDOMECHANICALS) ×10 IMPLANT
TROCAR XCEL DIL TIP R 11M (ENDOMECHANICALS) ×5 IMPLANT
WARMER LAPAROSCOPE (MISCELLANEOUS) ×5 IMPLANT
WATER STERILE IRR 1000ML POUR (IV SOLUTION) ×5 IMPLANT

## 2012-07-13 NOTE — Brief Op Note (Signed)
07/13/2012  11:30 AM  PATIENT:  Evelyn Dalton  37 y.o. female  PRE-OPERATIVE DIAGNOSIS:  Left lower quadrant pain, Menorrhagia, Endometrial Polyps    POST-OPERATIVE DIAGNOSIS:  Left lower quadrant pain, Menorrhagia, Endometrial Polyps, fibroid uterus, pelvic adhesions, left ovarian cyst,   PROCEDURE:  Diagnostic hysteroscopy, hysteroscopic resection of endometrial polyps, dilation and currettage, diagnostic laparoscopy, LOA, left ovarian cystectomy  SURGEON:  Surgeon(s) and Role:    * Leonce Bale Cathie Beams, MD - Primary  PHYSICIAN ASSISTANT:   ASSISTANTS: none   ANESTHESIA:   general and paracervical block  Findings: periovarian adhesions, pelvic adhesions, tubes nl, left ovarian cyst, right ovary immobile due to adhesions, nl liver edge, no evidence of endometriosis, post subserosal fibroid, multiple IM fibroids, endometrial cavity with multiple endometrial polyps  EBL:  Total I/O In: 1500 [I.V.:1500] Out: 325 [Urine:250; Blood:75]  BLOOD ADMINISTERED:none  DRAINS: none   LOCAL MEDICATIONS USED:  MARCAINE   , BUPIVICAINE  and OTHER nesicaine  SPECIMEN:  Source of Specimen:  emc w/ endom polyps, left ov cyst wall  DISPOSITION OF SPECIMEN:  PATHOLOGY  COUNTS:  YES  TOURNIQUET:  * No tourniquets in log *  DICTATION: .Other Dictation: Dictation Number [id number]  PLAN OF CARE: Discharge to home after PACU  PATIENT DISPOSITION:  PACU - hemodynamically stable.   Delay start of Pharmacological VTE agent (>24hrs) due to surgical blood loss or risk of bleeding: no

## 2012-07-13 NOTE — Anesthesia Postprocedure Evaluation (Signed)
  Anesthesia Post Note  Patient: Evelyn Dalton  Procedure(s) Performed: Procedure(s) (LRB): DILATATION & CURETTAGE/HYSTEROSCOPY WITH RESECTOCOPE (N/A) LAPAROSCOPIC LYSIS OF ADHESIONS (N/A) LAPAROSCOPIC OVARIAN CYSTECTOMY (Left) POLYPECTOMY (N/A)  Anesthesia type: GA  Patient location: PACU  Post pain: Pain level controlled  Post assessment: Post-op Vital signs reviewed  Last Vitals:  Filed Vitals:   07/13/12 1125  BP: 119/80  Pulse: 77  Temp:     Post vital signs: Reviewed  Level of consciousness: sedated  Complications: No apparent anesthesia complications

## 2012-07-13 NOTE — Preoperative (Signed)
Beta Blockers   Reason not to administer Beta Blockers:Not Applicable 

## 2012-07-13 NOTE — Transfer of Care (Signed)
Immediate Anesthesia Transfer of Care Note  Patient: Evelyn Dalton  Procedure(s) Performed: Procedure(s): DILATATION & CURETTAGE/HYSTEROSCOPY WITH RESECTOCOPE (N/A) LAPAROSCOPIC LYSIS OF ADHESIONS (N/A) LAPAROSCOPIC OVARIAN CYSTECTOMY (Left) POLYPECTOMY (N/A)  Patient Location: PACU  Anesthesia Type:General  Level of Consciousness: awake, alert  and oriented  Airway & Oxygen Therapy: Patient Spontanous Breathing and Patient connected to nasal cannula oxygen  Post-op Assessment: Report given to PACU RN and Post -op Vital signs reviewed and stable  Post vital signs: Reviewed  Complications: No apparent anesthesia complications

## 2012-07-13 NOTE — Progress Notes (Signed)
Pt stable and snoring at present, O2 removed will continue to monitor and manage

## 2012-07-14 NOTE — Op Note (Signed)
NAMELOUIE, Dalton              ACCOUNT NO.:  [id number]  MEDICAL RECORD NO.:  [id number]  LOCATION:  [facility]                          FACILITY:  [facility]  PHYSICIAN:  Maxie Better, M.D.DATE OF BIRTH:  November 15, 1975  DATE OF PROCEDURE:  07/13/2012 DATE OF DISCHARGE:  07/13/2012                              OPERATIVE REPORT   PREOPERATIVE DIAGNOSES: 1. Left lower quadrant pain. 2. Menorrhagia. 3. Endometrial polyps.  PROCEDURES: 1. Diagnostic hysteroscopy. 2. Hysteroscopic resection of endometrial polyps. 3. Dilation and curettage. 4. Diagnostic laparoscopy. 5. Lysis of adhesions. 6. Left ovarian cystectomy.  POSTOPERATIVE DIAGNOSES: 1. Left ovarian cyst. 2. Endometrial polyps. 3. Left lower quadrant pain. 4. Menorrhagia. 5. Pelvic adhesions.  ANESTHESIA:  General, paracervical block  SURGEON:  Maxie Better, MD  ASSISTANT:  None.  DESCRIPTION OF PROCEDURE:  Under adequate general anesthesia, the patient was placed in the dorsal lithotomy position.  Examination under anesthesia revealed irregular anteverted uterus.  No adnexal masses could be appreciated.  The patient was sterilely prepped and draped in usual fashion.  A bivalve speculum was placed in the vagina.  A 20 mL of 1% Nesacaine was injected paracervically at the 3 and 9 o'clock position.  Cervix was then serially dilated up to a #29 Pratt dilator. A resectoscope with a single loop was inserted into the uterine cavity. Multiple polypoid lesions were noted.  Tubal ostia were seen.  The resectoscope was then used to resect the polyp.  The resectoscope was removed.  The cavity was explored with polyp forceps.  The resectoscope was reinserted.  All polypoid lesions were removed, at which time the resectoscope was removed.  The cavity was then curetted for large amount of tissue.  The procedure was then completed.  An Acorn cannula was introduced into the uterine cavity and attached tenaculum for manipulation  of the uterus.  The bivalve speculum was removed. Attention was then turned back to the abdomen.  A 0.25% Marcaine was injected in infraumbilical incision.  Veress needle was introduced. Opening pressure 5 was noted.  A 3 L of CO2 was insufflated.  Veress needle was then removed.  A 10-mm trocar with sleeve was introduced into the abdomen without incident.  A lighted video laparoscope was then placed through that port.  Entrance into the abdomen was without incident.  Upper abdomen was inspected.  Normal liver edge noted.  The patient was placed in deep Trendelenburg.  The pelvis was inspected.  A 0.25% Marcaine was injected suprapubically.  Suprapubic incision was then made and a 5-mm port was then placed under direct visualization. Using a probe, the pelvis was fully inspected.  The right ovary was attached to the sidewall that was immobile.  There were adhesions from the tube to the posterior aspect of the uterus on the side.  Adhesions of the ovary to the pelvic sidewall.  There was a large subserosal fibroid, getting off the posterior aspect of the uterus and several other evidence of fibroid noted throughout the uterine surface.  On the left, there was ovary that was encased with thin adhesions with the cyst on it.  The posterior cul-de-sac was without any evidence of endometriosis or adhesions.  There were some adhesions  around the uterus itself.  A third port site was placed in the left lower quadrant and a 5- mm port was placed under direct visualization.  Using a combination of those lower ports, a hot scissor was then placed.  The thin adhesions were lysed, those along the uterus were also lysed.  The adhesions around that left ovary was lysed, removed.  The left ovary was much more mobile.  The left tube was more mobile thereafter.  On the right, the right ovary could not be lifted up and on further inspection, adhesions were involved in those.  These adhesions were then again  lysed and removed.  Once all the adhesions were removed, attention was then turned to the left ovary.  The cystic mass was opened with cautery.  The cyst wall was then removed in pieces and hemostasis was achieved with cautery.  The abdomen was copiously irrigated and suctioned. Intraoperative doxycycline was given once the evidence of adhesions had been noted.  The pelvis was once again inspected.  The ovary on the right was clearly now mobile.  The two fallopian tubes were normal and the pelvis was again irrigated and suctioned.  Good hemostasis was noted.  At that point, bupivacaine 60 mL was instilled in the pelvis for pain management.  The lower port sites were then removed.  The abdomen was deflated and the infraumbilical site was removed, taking care not to bring up any underlying structures.  SPECIMEN:  Endometrial curettings with endometrial polyps.  The left ovarian cyst wall sent to Pathology.  FLUID DEFICIT:  100 mL.  URINE OUTPUT:  At least 300 mL.  COUNTS:  Sponge and instrument counts x2 was correct.  COMPLICATIONS:  None.  The patient tolerated the procedure well and was transferred to the recovery room in stable condition.     Maxie Better, M.D.     [place]/MED[facility]  D:  07/13/2012  T:  07/14/2012  Job:  [id number]

## 2012-07-16 ENCOUNTER — Encounter (HOSPITAL_COMMUNITY): Payer: Self-pay | Admitting: Obstetrics and Gynecology

## 2012-07-16 MED FILL — Heparin Sodium (Porcine) Inj 5000 Unit/ML: INTRAMUSCULAR | Qty: 1 | Status: AC

## 2012-08-06 ENCOUNTER — Other Ambulatory Visit: Payer: Self-pay | Admitting: Obstetrics and Gynecology

## 2012-08-06 DIAGNOSIS — R1033 Periumbilical pain: Secondary | ICD-10-CM

## 2012-08-08 ENCOUNTER — Other Ambulatory Visit: Payer: 59

## 2020-10-15 ENCOUNTER — Other Ambulatory Visit: Payer: Self-pay

## 2020-10-15 ENCOUNTER — Encounter (HOSPITAL_COMMUNITY): Payer: Self-pay

## 2020-10-15 ENCOUNTER — Emergency Department (HOSPITAL_COMMUNITY): Payer: No Typology Code available for payment source

## 2020-10-15 ENCOUNTER — Emergency Department (HOSPITAL_COMMUNITY)
Admission: EM | Admit: 2020-10-15 | Discharge: 2020-10-15 | Disposition: A | Discharge status: Home / Self Care | Payer: No Typology Code available for payment source | Attending: Emergency Medicine | Admitting: Emergency Medicine

## 2020-10-15 DIAGNOSIS — R799 Abnormal finding of blood chemistry, unspecified: Secondary | ICD-10-CM | POA: Diagnosis present

## 2020-10-15 DIAGNOSIS — D649 Anemia, unspecified: Secondary | ICD-10-CM | POA: Diagnosis not present

## 2020-10-15 LAB — CBC WITH DIFFERENTIAL/PLATELET
Abs Immature Granulocytes: 0.02 10*3/uL (ref 0.00–0.07)
Basophils Absolute: 0 10*3/uL (ref 0.0–0.1)
Basophils Relative: 0 %
Eosinophils Absolute: 0 10*3/uL (ref 0.0–0.5)
Eosinophils Relative: 1 %
HCT: 25.6 % — ABNORMAL LOW (ref 36.0–46.0)
Hemoglobin: 6.7 g/dL — CL (ref 12.0–15.0)
Immature Granulocytes: 0 %
Lymphocytes Relative: 22 %
Lymphs Abs: 1.4 10*3/uL (ref 0.7–4.0)
MCH: 16.9 pg — ABNORMAL LOW (ref 26.0–34.0)
MCHC: 26.2 g/dL — ABNORMAL LOW (ref 30.0–36.0)
MCV: 64.5 fL — ABNORMAL LOW (ref 80.0–100.0)
Monocytes Absolute: 0.6 10*3/uL (ref 0.1–1.0)
Monocytes Relative: 9 %
Neutro Abs: 4.4 10*3/uL (ref 1.7–7.7)
Neutrophils Relative %: 68 %
Platelets: 473 10*3/uL — ABNORMAL HIGH (ref 150–400)
RBC: 3.97 MIL/uL (ref 3.87–5.11)
RDW: 19.4 % — ABNORMAL HIGH (ref 11.5–15.5)
WBC: 6.5 10*3/uL (ref 4.0–10.5)
nRBC: 0 % (ref 0.0–0.2)

## 2020-10-15 LAB — COMPREHENSIVE METABOLIC PANEL
ALT: 20 U/L (ref 0–44)
AST: 18 U/L (ref 15–41)
Albumin: 3.8 g/dL (ref 3.5–5.0)
Alkaline Phosphatase: 33 U/L — ABNORMAL LOW (ref 38–126)
Anion gap: 12 (ref 5–15)
BUN: 15 mg/dL (ref 6–20)
CO2: 20 mmol/L — ABNORMAL LOW (ref 22–32)
Calcium: 9.2 mg/dL (ref 8.9–10.3)
Chloride: 108 mmol/L (ref 98–111)
Creatinine, Ser: 0.75 mg/dL (ref 0.44–1.00)
GFR, Estimated: >60 mL/min (ref >60)
Glucose, Bld: 92 mg/dL (ref 70–99)
Potassium: 3.3 mmol/L — ABNORMAL LOW (ref 3.5–5.1)
Sodium: 140 mmol/L (ref 135–145)
Total Bilirubin: 0.4 mg/dL (ref 0.3–1.2)
Total Protein: 8 g/dL (ref 6.5–8.1)

## 2020-10-15 LAB — PREPARE RBC (CROSSMATCH)

## 2020-10-15 LAB — ABO/RH: ABO/RH(D): B POS

## 2020-10-15 MED ORDER — SODIUM CHLORIDE 0.9% IV SOLUTION: Freq: Once | INTRAVENOUS | Status: AC

## 2020-10-15 MED ORDER — IOHEXOL 300 MG/ML  SOLN
100.0000 mL | Freq: Once | INTRAMUSCULAR | Status: AC | PRN
  Administered 2020-10-15: 100 mL via INTRAVENOUS

## 2020-10-15 NOTE — Discharge Instructions (Addendum)
You were provided with a copy of your CT result on today's visit.  We will also transfuse 1 unit of blood for your symptomatic anemia.  Please continue to follow-up with your OB/GYN.  If you begin to experience any vaginal bleeding, rectal bleeding, worsening symptoms please return to the ED for further evaluation.

## 2020-10-15 NOTE — ED Triage Notes (Signed)
Patient states she went for her 6 month check in and Hgb ws to be 6.9. Paient states she has a history of anemia and low iron.

## 2020-10-15 NOTE — ED Notes (Signed)
Per PCP-states history of uterine fibroids, anemia-states low Hgb-sending to ED for transfusion

## 2020-10-15 NOTE — ED Provider Notes (Signed)
[facility] DEPT Provider Note   CSN: [id number] Arrival date & time: 10/15/20  1728     History Chief Complaint  Patient presents with  . Abnormal Lab    Evelyn Dalton is a 45 y.o. female.  45 y.o female with a PMH Anemia presents to the ED with a chief complaint of fatigue for the past 2 to 3 weeks.  Patient reports she was called by her PCP in order to obtain a blood transfusion.  Her last blood work was drawn yesterday, she had approximately a hemoglobin of 6.9.  She does not have a longstanding history of fibroids, she has been taking oral medication in order to control this bleeding, has continued to have vaginal bleeding, changing 1 pad every 4 hours.  She does report feeling more fatigued overall, feels lightheaded when she bends over.  She received a transfusion in December 2021, without any complications.  She denies any vomiting, nausea, rectal bleeding, or headaches.    The history is provided by the patient.  Abnormal Lab Time since result:  6.9 Patient referred by:  PCP Resulting agency:  Internal Result type: hematology        Past Medical History:  Diagnosis Date  . Anemia    has been on iron - none since Nov. 2013  . Family history of anesthesia complication    pt's uncle has [place], [place]. grandmother's niece died after surgery 36yrs ago  . Heart murmur    heard by Dr. Garwin Brothers  . Malignant hyperthermia    pt has never been tested- maternal uncle has MH    There are no problems to display for this patient.   Past Surgical History:  Procedure Laterality Date  . DILATATION & CURRETTAGE/HYSTEROSCOPY WITH RESECTOCOPE N/A 07/13/2012   Procedure: DILATATION & CURETTAGE/HYSTEROSCOPY WITH RESECTOCOPE;  Surgeon: Marvene Staff, MD;  Location: [facility];  Service: Gynecology;  Laterality: N/A;  . LAPAROSCOPIC LYSIS OF ADHESIONS N/A 07/13/2012   Procedure: LAPAROSCOPIC LYSIS OF ADHESIONS;  Surgeon: Marvene Staff, MD;  Location: [facility];  Service: Gynecology;  Laterality: N/A;  . LAPAROSCOPIC OVARIAN CYSTECTOMY Left 07/13/2012   Procedure: LAPAROSCOPIC OVARIAN CYSTECTOMY;  Surgeon: Marvene Staff, MD;  Location: [facility];  Service: Gynecology;  Laterality: Left;  . NO PAST SURGERIES    . POLYPECTOMY N/A 07/13/2012   Procedure: POLYPECTOMY;  Surgeon: Marvene Staff, MD;  Location: [facility];  Service: Gynecology;  Laterality: N/A;     OB History   No obstetric history on file.     Family History  Problem Relation Age of Onset  . Hypertension Mother   . High Cholesterol Mother   . Heart murmur Mother   . Hypertension Father     Social History   Tobacco Use  . Smoking status: Never Smoker  . Smokeless tobacco: Never Used  Vaping Use  . Vaping Use: Never used  Substance Use Topics  . Alcohol use: No  . Drug use: No    Home Medications Prior to Admission medications   Medication Sig Start Date End Date Taking? Authorizing Provider  amLODipine (NORVASC) 10 MG tablet Take 10 mg by mouth daily. 10/05/20  Yes [provider]  cholecalciferol (VITAMIN D3) 25 MCG (1000 UNIT) tablet Take 1,000 Units by mouth daily.   Yes [provider]  diltiazem 2 % GEL Apply 1 application topically See admin instructions. 1 application rectally 2 times daily   Yes [provider]  fexofenadine (ALLEGRA) 60  MG tablet Take 60 mg by mouth daily.   Yes [provider]  fluticasone (FLONASE) 50 MCG/ACT nasal spray Place 1 spray into both nostrils daily as needed for allergies or rhinitis.   Yes [provider]  hydrochlorothiazide (HYDRODIURIL) 25 MG tablet Take 25 mg by mouth daily.   Yes [provider]  ibuprofen (ADVIL,MOTRIN) 200 MG tablet Take 4 tablets (800 mg total) by mouth every 8 (eight) hours as needed. For headache or general pain. Patient taking differently: Take 800 mg by mouth every 8 (eight) hours as needed for mild pain. 07/13/12  Yes Servando Salina, MD   medroxyPROGESTERone (PROVERA) 10 MG tablet Take 20 mg by mouth 3 (three) times daily. 09/21/20  Yes [provider]  montelukast (SINGULAIR) 10 MG tablet Take 10 mg by mouth at bedtime. 07/30/20  Yes [provider]  [place] 0.1-20 MG-MCG tablet Take 1 tablet by mouth daily. 08/27/20  Yes [provider]  oxyCODONE-acetaminophen (ROXICET) 5-325 MG per tablet Take 1 tablet by mouth every 4 (four) hours as needed for pain. Patient not taking: No sig reported 07/13/12   Servando Salina, MD    Allergies    Patient has no known allergies.  Review of Systems   Review of Systems  Constitutional: Positive for fatigue. Negative for chills and fever.  HENT: Negative for sore throat.   Respiratory: Negative for shortness of breath.   Cardiovascular: Negative for chest pain.  Gastrointestinal: Negative for abdominal pain, nausea and vomiting.  Genitourinary: Negative for flank pain.  Musculoskeletal: Negative for back pain.  Neurological: Positive for light-headedness. Negative for headaches.  All other systems reviewed and are negative.   Physical Exam Updated Vital Signs BP 130/76   Pulse 78   Temp 98.1 F (36.7 C)   Resp 16   Ht 5\' 6"  (1.676 m)   Wt 91.2 kg   LMP 06/17/2020 (Approximate)   SpO2 100%   BMI 32.44 kg/m   Physical Exam Vitals and nursing note reviewed.  Constitutional:      Appearance: Normal appearance.  HENT:     Head: Normocephalic and atraumatic.     Nose: Nose normal.     Mouth/Throat:     Mouth: Mucous membranes are moist.  Eyes:     Pupils: Pupils are equal, round, and reactive to light.     Comments: Conjunctiva appears pale.  Cardiovascular:     Rate and Rhythm: Normal rate.  Pulmonary:     Effort: Pulmonary effort is normal.     Breath sounds: No wheezing or rales.     Comments: Lungs are clear to auscultation with no wheezing, rhonchi, rales. Abdominal:     General: Abdomen is flat. Bowel sounds are decreased. There is  distension.     Palpations: Abdomen is soft. There is mass.     Tenderness: There is no abdominal tenderness. There is no right CVA tenderness or left CVA tenderness.     Comments: Palpable mass on the abdomen covering most of the abdominal wall. Bowel sounds are present and equal.   Musculoskeletal:     Cervical back: Normal range of motion and neck supple.  Skin:    General: Skin is warm and dry.  Neurological:     Mental Status: She is alert and oriented to person, place, and time.     ED Results / Procedures / Treatments   Labs (all labs ordered are listed, but only abnormal results are displayed) Labs Reviewed  CBC WITH DIFFERENTIAL/PLATELET -  Abnormal; Notable for the following components:      Result Value   Hemoglobin 6.7 (*)    HCT 25.6 (*)    MCV 64.5 (*)    MCH 16.9 (*)    MCHC 26.2 (*)    RDW 19.4 (*)    Platelets 473 (*)    All other components within normal limits  COMPREHENSIVE METABOLIC PANEL - Abnormal; Notable for the following components:   Potassium 3.3 (*)    CO2 20 (*)    Alkaline Phosphatase 33 (*)    All other components within normal limits  TYPE AND SCREEN  PREPARE RBC (CROSSMATCH)  ABO/RH    EKG None  Radiology CT ABDOMEN PELVIS W CONTRAST  Result Date: 10/15/2020 CLINICAL DATA:  Abdominal distension EXAM: CT ABDOMEN AND PELVIS WITH CONTRAST TECHNIQUE: Multidetector CT imaging of the abdomen and pelvis was performed using the standard protocol following bolus administration of intravenous contrast. CONTRAST:  166mL OMNIPAQUE IOHEXOL 300 MG/ML  SOLN COMPARISON:  06/08/2010 FINDINGS: Lower chest: No acute abnormality. Hepatobiliary: Liver is well visualized and shows a 1 cm hypodensity in the midportion of the right lobe of the liver best seen on image number 25 of series 2 likely representing a cyst but too small for adequate characterization on this exam. The gallbladder is within normal limits. Pancreas: Unremarkable. No pancreatic ductal  dilatation or surrounding inflammatory changes. Spleen: Normal in size without focal abnormality. Adrenals/Urinary Tract: Adrenal glands are within normal limits. Kidneys are well visualized bilaterally. Normal excretion of contrast is seen bilaterally. The bladder is partially distended. Stomach/Bowel: Colon shows no obstructive or inflammatory changes. The appendix is within normal limits. Small bowel and stomach are unremarkable. Vascular/Lymphatic: Aortic atherosclerosis. No enlarged abdominal or pelvic lymph nodes. Reproductive: Uterus is significantly enlarged measuring 17 cm in greatest transverse dimension and at least 22 cm in craniocaudad projection. Multiple enhancing lesions are noted within most consistent with uterine fibroids. No adnexal mass is noted. Other: No abdominal wall hernia or abnormality. No abdominopelvic ascites. Musculoskeletal: No acute or significant osseous findings. IMPRESSION: Significantly enlarged uterus with multiple mass lesions within. The largest of these lesions measures 11 cm by 13 cm. These changes are most consistent with an enlarged fibroid uterus. Hypodensity within the liver likely representing a cyst. No other focal abnormality is noted. Electronically Signed   By: Inez Catalina M.D.   On: 10/15/2020 20:29    Procedures .Critical Care Performed by: Janeece Fitting, PA-C Authorized by: Janeece Fitting, PA-C   Critical care provider statement:    Critical care time (minutes):  45   Critical care start time:  10/15/2020 7:30 PM   Critical care end time:  10/15/2020 8:15 PM   Critical care time was exclusive of:  Separately billable procedures and treating other patients   Critical care was necessary to treat or prevent imminent or life-threatening deterioration of the following conditions:  Circulatory failure   Critical care was time spent personally by me on the following activities:  Blood draw for specimens, development of treatment plan with patient or surrogate,  discussions with consultants, evaluation of patient's response to treatment, examination of patient, obtaining history from patient or surrogate, ordering and performing treatments and interventions, ordering and review of laboratory studies, ordering and review of radiographic studies, pulse oximetry, re-evaluation of patient's condition and review of old charts     Medications Ordered in ED Medications  0.9 %  sodium chloride infusion (Manually program via Guardrails IV Fluids) ( Intravenous New Bag/Given  10/15/20 2143)  iohexol (OMNIPAQUE) 300 MG/ML solution 100 mL (100 mLs Intravenous Contrast Given 10/15/20 2000)    ED Course  I have reviewed the triage vital signs and the nursing notes.  Pertinent labs & imaging results that were available during my care of the patient were reviewed by me and considered in my medical decision making (see chart for details).  Clinical Course as of 10/15/20 2251  Thu Oct 15, 2020  2041 Hemoglobin(!!): 6.7 [JS]  2042 Potassium(!): 3.3 [JS]    Clinical Course User Index [JS] Janeece Fitting, PA-C   MDM Rules/Calculators/A&P    Patient presents to the ED sent in by PCP for blood transfusion.  Patient had blood work done yesterday in office, this revealed a hemoglobin of 6.9, patient has been increased fatigue, feels over run the last couple of weeks.  She does have a history of uterine fibroids, had vaginal bleeding for several weeks, was placed on oral progesterone along with recurrent transfusions due to ongoing bleeding.  She is currently bleeding only 1 pad every 4 hours.  She arrived in the ED hemodynamically stable, no tachycardia denies any chest pain, shortness of breath.  During evaluation she is overall well appearing, lungs are clear to auscultation.  Abdomen is soft, there is a palpable mass noted up to her lower diaphragm, this mass is nonmobile, there is no pain with palpation, bowel sounds are slightly diminished.  Some rebound noted.  Moves all  upper and lower extremities.  The region of her labs reveal a CBC with no leukocytosis, hemoglobin shows visit of 6.7, will order 1 unit of blood to transfuse patient at this time.  CMP remarkable for some hypokalemia at 3.3, creatinine levels within normal limits.  LFTs are unremarkable. She does report a new mass that is present now for the past 2 weeks, she was advised by PCP to obtain an outpatient CT, due to patient being in the ED for several hours due to transfusion, we discussed obtaining CT imaging at this time.  CT abdomen and pelvis showed: Reproductive: Uterus is significantly enlarged measuring 17 cm in  greatest transverse dimension and at least 22 cm in craniocaudad  projection. Multiple enhancing lesions are noted within most  consistent with uterine fibroids. No adnexal mass is noted.    Patient received 1 unit of blood while in the ED for her symptomatic anemia.  She is requesting discharge home at this time.  Vitals are within normal limits.  She has not had any prior reaction to blood products, continues to be asymptomatic after receiving blood products.   Portions of this note were generated with [employer]. Dictation errors may occur despite best attempts at proofreading.  Final Clinical Impression(s) / ED Diagnoses Final diagnoses:  Symptomatic anemia    Rx / DC Orders ED Discharge Orders    None       Janeece Fitting, PA-C 10/15/20 2251    Daleen Bo, MD 10/17/20 1053

## 2020-10-16 LAB — BPAM RBC
Blood Product Expiration Date: 202206142359
ISSUE DATE / TIME: 202206022130
Unit Type and Rh: 7300

## 2020-10-16 LAB — TYPE AND SCREEN
ABO/RH(D): B POS
Antibody Screen: NEGATIVE
Unit division: 0
